# Patient Record
Sex: Female | Born: 1970 | Race: Black or African American | Hispanic: No | Marital: Single | Smoking: Never smoker
Health system: Southern US, Community
[De-identification: ages and names within clinical notes are randomized; demographics above are authoritative.]

## PROBLEM LIST (undated history)

## (undated) DIAGNOSIS — IMO0002 Reserved for concepts with insufficient information to code with codable children: Secondary | ICD-10-CM

## (undated) HISTORY — PX: EYE SURGERY: SHX253

## (undated) HISTORY — PX: CATARACT EXTRACTION: SUR2

---

## 2013-07-26 ENCOUNTER — Encounter (HOSPITAL_COMMUNITY): Payer: Self-pay | Admitting: Emergency Medicine

## 2013-07-26 ENCOUNTER — Emergency Department (HOSPITAL_COMMUNITY): Payer: Self-pay

## 2013-07-26 ENCOUNTER — Emergency Department (HOSPITAL_COMMUNITY)
Admission: EM | Admit: 2013-07-26 | Discharge: 2013-07-26 | Disposition: A | Discharge status: Home / Self Care | Payer: Self-pay | Attending: Emergency Medicine | Admitting: Emergency Medicine

## 2013-07-26 DIAGNOSIS — Z3202 Encounter for pregnancy test, result negative: Secondary | ICD-10-CM | POA: Insufficient documentation

## 2013-07-26 DIAGNOSIS — Z8739 Personal history of other diseases of the musculoskeletal system and connective tissue: Secondary | ICD-10-CM | POA: Insufficient documentation

## 2013-07-26 DIAGNOSIS — M545 Low back pain, unspecified: Secondary | ICD-10-CM | POA: Insufficient documentation

## 2013-07-26 DIAGNOSIS — G8929 Other chronic pain: Secondary | ICD-10-CM | POA: Insufficient documentation

## 2013-07-26 HISTORY — DX: Reserved for concepts with insufficient information to code with codable children: IMO0002

## 2013-07-26 LAB — POC URINE PREG, ED: Preg Test, Ur: NEGATIVE

## 2013-07-26 MED ORDER — IBUPROFEN 600 MG PO TABS: 600.0000 mg | ORAL_TABLET | Freq: Four times a day (QID) | ORAL | Status: AC | PRN

## 2013-07-26 MED ORDER — HYDROCODONE-ACETAMINOPHEN 5-325 MG PO TABS: 1.0000 | ORAL_TABLET | ORAL | Status: AC | PRN

## 2013-07-26 MED ORDER — HYDROCODONE-ACETAMINOPHEN 5-325 MG PO TABS
1.0000 | ORAL_TABLET | Freq: Once | ORAL | Status: AC
  Administered 2013-07-26: 1 via ORAL
  Filled 2013-07-26: qty 1

## 2013-07-26 NOTE — Discharge Instructions (Signed)
Back Pain, Adult °Low back pain is very common. About 1 in 5 people have back pain. The cause of low back pain is rarely dangerous. The pain often gets better over time. About half of people with a sudden onset of back pain feel better in just 2 weeks. About 8 in 10 people feel better by 6 weeks.  °CAUSES °Some common causes of back pain include: °· Strain of the muscles or ligaments supporting the spine. °· Wear and tear (degeneration) of the spinal discs. °· Arthritis. °· Direct injury to the back. °DIAGNOSIS °Most of the time, the direct cause of low back pain is not known. However, back pain can be treated effectively even when the exact cause of the pain is unknown. Answering your caregiver's questions about your overall health and symptoms is one of the most accurate ways to make sure the cause of your pain is not dangerous. If your caregiver needs more information, he or she may order lab work or imaging tests (X-rays or MRIs). However, even if imaging tests show changes in your back, this usually does not require surgery. °HOME CARE INSTRUCTIONS °For many people, back pain returns. Since low back pain is rarely dangerous, it is often a condition that people can learn to manage on their own.  °· Remain active. It is stressful on the back to sit or stand in one place. Do not sit, drive, or stand in one place for more than 30 minutes at a time. Take short walks on level surfaces as soon as pain allows. Try to increase the length of time you walk each day. °· Do not stay in bed. Resting more than 1 or 2 days can delay your recovery. °· Do not avoid exercise or work. Your body is made to move. It is not dangerous to be active, even though your back may hurt. Your back will likely heal faster if you return to being active before your pain is gone. °· Pay attention to your body when you  bend and lift. Many people have less discomfort when lifting if they bend their knees, keep the load close to their bodies, and  avoid twisting. Often, the most comfortable positions are those that put less stress on your recovering back. °· Find a comfortable position to sleep. Use a firm mattress and lie on your side with your knees slightly bent. If you lie on your back, put a pillow under your knees. °· Only take over-the-counter or prescription medicines as directed by your caregiver. Over-the-counter medicines to reduce pain and inflammation are often the most helpful. Your caregiver may prescribe muscle relaxant drugs. These medicines help dull your pain so you can more quickly return to your normal activities and healthy exercise. °· Put ice on the injured area. °¨ Put ice in a plastic bag. °¨ Place a towel between your skin and the bag. °¨ Leave the ice on for 15-20 minutes, 03-04 times a day for the first 2 to 3 days. After that, ice and heat may be alternated to reduce pain and spasms. °· Ask your caregiver about trying back exercises and gentle massage. This may be of some benefit. °· Avoid feeling anxious or stressed. Stress increases muscle tension and can worsen back pain. It is important to recognize when you are anxious or stressed and learn ways to manage it. Exercise is a great option. °SEEK MEDICAL CARE IF: °· You have pain that is not relieved with rest or medicine. °· You have pain that does not improve in 1 week. °· You have new symptoms. °· You are generally not feeling well. °SEEK   IMMEDIATE MEDICAL CARE IF:   You have pain that radiates from your back into your legs.  You develop new bowel or bladder control problems.  You have unusual weakness or numbness in your arms or legs.  You develop nausea or vomiting.  You develop abdominal pain.  You feel faint. Document Released: 01/05/2005 Document Revised: 07/07/2011 Document Reviewed: 05/26/2010 [facility] Patient Information 2015 [place], [place]. This information is not intended to replace advice given to you by your health care provider. Make sure you  discuss any questions you have with your health care provider.    Emergency Department Resource Guide 1) Find a Doctor and Pay Out of Pocket Although you won't have to find out who is covered by your insurance plan, it is a good idea to ask around and get recommendations. You will then need to call the office and see if the doctor you have chosen will accept you as a new patient and what types of options they offer for patients who are self-pay. Some doctors offer discounts or will set up payment plans for their patients who do not have insurance, but you will need to ask so you aren't surprised when you get to your appointment.  2) Contact Your Local Health Department Not all health departments have doctors that can see patients for sick visits, but many do, so it is worth a call to see if yours does. If you don't know where your local health department is, you can check in your phone book. The CDC also has a tool to help you locate your state's health department, and many state websites also have listings of all of their local health departments.  3) Find a Walk-in Clinic If your illness is not likely to be very severe or complicated, you may want to try a walk in clinic. These are popping up all over the country in pharmacies, drugstores, and shopping centers. They're usually staffed by nurse practitioners or physician assistants that have been trained to treat common illnesses and complaints. They're usually fairly quick and inexpensive. However, if you have serious medical issues or chronic medical problems, these are probably not your best option.  No Primary Care Doctor: - Call Health Connect at  [phone] - they can help you locate a primary care doctor that  accepts your insurance, provides certain services, etc. - Physician Referral Service- [phone]  Chronic Pain Problems: Organization         Address  Phone   Notes  [facility]  [phone]) [phone] Patients need to  be referred by their primary care doctor.   Medication Assistance: Organization         Address  Phone   Notes  [facility] [address] [place], [place] [address] [phone] --Must be a resident of [place] -- Must have NO insurance coverage whatsoever (no Medicaid/ Medicare, etc.) -- The pt. MUST have a primary care doctor that directs their care regularly and follows them in the community   MedAssist  [phone]   [employer]  [phone]    Agencies that provide inexpensive medical care: Organization         Address  Phone   Notes  [facility]  [phone]   [facility]    [phone]) [phone]   [facility] [address] [place], [place] [address] [phone]   [facility] [address]  [address], [place] [phone]   Planned Parenthood    [phone]   [facility]    [phone]   [facility]  [address], [place] Phone:  [phone], Fax:  [phone] Hours of Operation:  9 am - 6 pm, M-F.  Also accepts Medicaid/Medicare and self-pay.  [facility] for [place], [address], [place] Phone: [phone], Fax: [phone]. Hours of Operation:  8:30 am - 5:30 pm, M-F.  Also accepts Medicaid and self-pay.  [facility] [place] [address] [place] Phone: [phone]   [place], [place], [place] [phone], Ext. [phone] Mondays & Thursdays: 7-9 AM.  First 15 patients are seen on a first come, first serve basis.    [place] Providers:  Organization         Address  Phone   Notes  [facility] [address], [place] [phone] Also accepts self-pay patients.  [facility] [address], [place]  [phone]   [place], [address], [place] [phone]   [facility] [address] [place] [phone]   Lucianne Lei [address], [place]   [phone] Only accepts [place] Access [place] patients after they have their name applied to their card.   Self-Pay (no insurance) in [facility]:  Organization         Address  Phone   Notes  Sickle Cell Patients, [facility] [place] [phone]   [facility] [place] [phone]   [facility] [place]  [place], [place], [place] [phone]   [facility]/Dr. Osei-Bonsu  [address], [place] or [address], [place] [phone] Phone number for both [place] and [place] locations is the same.  [facility] and [facility] [address], [place] [phone]   [facility] [address] [place] or [address] [phone]   [facility] [address] [place] [id number], phone; [phone], fax Sees patients 1st and 3rd Saturday of every month.  Must not qualify for public or private insurance (i.e. Medicaid, Medicare, [place] Health Choice, Veterans' Benefits)  Household income should be no more than 200% of the poverty level The clinic cannot treat you if you are pregnant or think you are pregnant  Sexually transmitted diseases are not treated at the clinic.    Dental Care: Organization         Address  Phone  Notes  [facility] Department of [facility] [place] [phone] Accepts children up to age 32 who are enrolled in [place] or [place]; pregnant women with a Medicaid card; and children who have applied for Medicaid or  Health Choice, but were declined, whose parents can  pay a reduced fee at time of service.  [facility] of [facility]  [address], [place] [phone] Accepts children up to age 61 who are enrolled in [place] or [place]; pregnant women with a Medicaid card; and children who have applied for Medicaid or  [place] Health Choice, but were declined, whose parents can pay a reduced fee at time of service.  [place] Adult Dental Access PROGRAM  [place] [phone] Patients are seen by appointment only. Walk-ins are not accepted. [place] will see patients 46 years of age and older. Monday - Tuesday (8am-5pm) Most Wednesdays (8:30-5pm) $30 per visit, cash only  [facility] Access PRO[facility]  [address], [facility] [phone] Patients are seen by appointment only. Walk-ins are not accepted. [place] will see patients 66 years of age and older. One Wednesday Evening (Monthly: Volunteer Based).  $30 per visit, cash only  [place]  [phone] for adults; Children under age 61, call Graduate Pediatric Dentistry at [phone]. Children aged 61-14, please call [phone] to request a pediatric application.  Dental services are provided in all areas of dental care including fillings, crowns and bridges, complete and partial dentures, implants, gum treatment, root canals, and extractions. Preventive care is also provided. Treatment is provided to both adults and children. Patients are selected via a lottery and there is often a waiting list.   [facility] [address] [place]  [phone] [email or web address]   [facility] [address] [place], [place] [phone], Ext. [phone] Second and Fourth Thursday of each month, opens at 6:30 AM; Clinic ends at 9 AM.  Patients are seen on a first-come first-served basis, and a limited number are seen during each clinic.   [facility]  [address] Hillard Danker [place], [place] [phone]   Eligibility Requirements You must have lived in [place], [place], or [place] counties for at least the last three months.   You cannot be eligible for state or federal sponsored [employer], including [employer], [place], or [employer].   You generally cannot be eligible for healthcare insurance through your employer.    How to apply: Eligibility screenings are held every Tuesday and Wednesday afternoon from 1:00 pm until 4:00 pm. You do not need an appointment for the interview!  [facility] [address] [place], [place]   [place]  [place] Department  [place]  [phone]    Behavioral Health Resources in the Community: Intensive Outpatient Programs Organization         Address  Phone  Notes  [place]. [address], [place], [place] [phone]   [facility] Outpatient [address] [place], [place]   ADS: Alcohol & Drug Svcs [address], [place], [place]   [place] [address]  [place], [place] or [phone]   Substance Abuse Resources Organization         Address  Phone  Notes  Alcohol and Drug Services  [phone]   [place]  [phone]   [facility] [place]   Chinita Pester  [phone]   Residential & Outpatient Substance Abuse Program  [phone]   Psychological Services Organization         Address  Phone  Notes  [facility] [place]  [place]  [phone]   [place] [address] [place] or [phone]    Mobile Crisis Teams Organization         Address  Phone  Notes  [facility], Mobile Crisis Care Unit  [phone]    [facility]  [address] [place], [place] [phone]   [facility] [address] [place] [phone]    Self-Help/Support Groups Organization         Address  Phone             Notes  [facility] [place] - variety of support groups  336- [phone] Call for more information  Narcotics Anonymous (NA), [facility]aring [facility] [address], [place]  2 meetings at this location   Statisticianesidential Treatment Programs Organization         Address  Phone  Notes  [facility] [address],    [place]  [phone]   [facility]  [address], [place] [phone], [place], [place] [phone]   [facility] [address], [place] [phone] Admissions: 8am-3pm M-F  [facility] [address]    [place], [place] [phone]   [facility] [address], [place], [place] [phone]   [facility] [address]  [place], [place] [phone]   [facility] - Intensive Outpatient [address], [place], [place] [phone]   [facility] (Addiction Recovery Care [facility] [address]  [place], [place] [phone] or [phone]   [facility] (RTS) [address] [place], [place] [phone] Accepts Medicaid  [facility] [address]  [place] [phone] Substance Abuse/Addiction Treatment   [facility] Behavioral Health Resources Organization         Address  Phone  Notes  [facility]  [phone](888) [phone]   Angie FavaJulie Brannon, PhD [address], [address] [place], [place]   [phone](336) [phone] or [phone]) [phone]   [facility]   [address] [place], [place] [phone]) [phone]   [facility] [address], [place], [place] [phone]) [phone] Insurance/Medicaid/sponsorship through [facility]  [facility] Families [address]                                     [place], [place] [phone]) [phone] Therapy/tele-psych/case  [facility] [address].   [place], [place] [phone](336) [phone]    Dr. Lolly MustacheArfeen  [phone](336) [phone]   [facility] [place]  [employer] [facility] 1) [address], [place] 2) [address], [place] 3)  [address], [place] [phone](336) [phone]  [phone]   [facility] Child Abuse Hotline [phone](336) [phone] or [phone](336) [phone] (After Hours)          Do not drive within 4 hours of taking hydrocodone as this will make you drowsy.  Avoid lifting,  Bending,  Twisting or any other activity that worsens your pain over the next week.  Apply an  icepack  to your lower back for 10-15 minutes every 2 hours for the next 2 days.  You should get rechecked if your symptoms are not better over the next 5 days,  Or you develop increased pain,  Weakness in your leg(s) or loss of bladder or bowel function - these are symptoms of a worse injury.

## 2013-07-26 NOTE — ED Provider Notes (Signed)
CSN: [id number]     Arrival date & time 07/26/13  1107 History   First MD Initiated Contact with Patient 07/26/13 1118     Chief Complaint  Patient presents with  . Back Pain     (Consider location/radiation/quality/duration/timing/severity/associated sxs/prior Treatment) Patient is a 43 y.o. female presenting with back pain.  Back Pain Associated symptoms: no abdominal pain, no chest pain, no dysuria, no fever, no numbness and no weakness      Sheena Conrad is a 43 y.o. female presenting with acute on chronic low back pain which has which has been worse since she woke this morning.   Patient denies any new injury specifically.  There is no radiation into her lower extremities.  There has been no weakness or numbness in the lower extremities and no urinary or bowel retention or incontinence.  Patient does not have a history of cancer or IVDU.   [facility] when she was diagnosed with "crumbling" bones in her back.  She's taken no medications prior to arrival for her pain and cannot identify any activities that have worsened her pain today.  She lifted a light box yesterday which she does not feel triggered today's symptoms.    She does not have a PCP.    Past Medical History  Diagnosis Date  . DDD (degenerative disc disease)    Past Surgical History  Procedure Laterality Date  . Eye surgery    . Cataract extraction     No family history on file. History  Substance Use Topics  . Smoking status: Never Smoker   . Smokeless tobacco: Not on file  . Alcohol Use: No   OB History   Grav Para Term Preterm Abortions TAB SAB Ect Mult Living                 Review of Systems  Constitutional: Negative for fever.  Respiratory: Negative for shortness of breath.   Cardiovascular: Negative for chest pain and leg swelling.  Gastrointestinal: Negative for abdominal pain, constipation and abdominal distention.  Genitourinary: Negative for  dysuria, urgency, frequency, flank pain and difficulty urinating.  Musculoskeletal: Positive for back pain. Negative for gait problem and joint swelling.  Skin: Negative for rash.  Neurological: Negative for weakness and numbness.      Allergies  Review of patient's allergies indicates no known allergies.  Home Medications   Prior to Admission medications   Medication Sig Start Date End Date Taking? Authorizing Provider  HYDROcodone-acetaminophen (NORCO/VICODIN) 5-325 MG per tablet Take 1 tablet by mouth every 4 (four) hours as needed for moderate pain. 07/26/13   Burgess AmorJulie Coree Brame, PA-C  ibuprofen (ADVIL,MOTRIN) 600 MG tablet Take 1 tablet (600 mg total) by mouth every 6 (six) hours as needed. 07/26/13   Burgess AmorJulie Leiani Enright, PA-C   BP 131/81  Pulse 84  Temp(Src) 97.3 F (36.3 C) (Oral)  Resp 18  Ht 5\' 4"  (1.626 m)  Wt 232 lb (105.235 kg)  BMI 39.80 kg/m2  SpO2 100%  LMP 07/22/2013 Physical Exam  Nursing note and vitals reviewed. Constitutional: She appears well-developed and well-nourished.  HENT:  Head: Normocephalic.  Eyes: Conjunctivae are normal.  Neck: Normal range of motion. Neck supple.  Cardiovascular: Normal rate and intact distal pulses.   Pedal pulses normal.  Pulmonary/Chest: Effort normal.  Abdominal: Soft. Bowel sounds are normal. She exhibits no distension and no mass.  Musculoskeletal: Normal range of motion. She exhibits no edema.  Lumbar back: She exhibits tenderness. She exhibits no swelling, no edema and no spasm.  Neurological: She is alert. She has normal strength. She displays no atrophy and no tremor. No sensory deficit. Gait normal.  Reflex Scores:      Patellar reflexes are 2+ on the right side and 2+ on the left side.      Achilles reflexes are 2+ on the right side and 2+ on the left side. No strength deficit noted in hip and knee flexor and extensor muscle groups.  Ankle flexion and extension intact.  Skin: Skin is warm and dry.  Psychiatric: She has a  normal mood and affect.    ED Course  Procedures (including critical care time) Labs Review Labs Reviewed  POC URINE PREG, ED    Imaging Review Dg Lumbar Spine Complete  07/26/2013   CLINICAL DATA:  Low back pain  EXAM: LUMBAR SPINE - COMPLETE 4+ VIEW  COMPARISON:  None.  FINDINGS: Five lumbar type vertebral bodies are well visualized. Very mild osteophytic changes are seen. Mild disc space narrowing is noted at L4-5. No spondylolysis or spondylolisthesis is seen.  IMPRESSION: Mild degenerative change without acute abnormality.   Electronically Signed   By: Alcide CleverMark  Lukens M.D.   On: 07/26/2013 12:29     EKG Interpretation None      MDM   Final diagnoses:  Chronic lower back pain    Patients labs and/or radiological studies were viewed and considered during the medical decision making and disposition process. No neuro deficit on exam or by history to suggest emergent or surgical presentation.  Also discussed worsened sx that should prompt immediate re-evaluation including distal weakness, bowel/bladder retention/incontinence.  Pt was prescribed hydrocodone and ibuprofen,  Encouraged heat tx.  Referrals given for obtaining pcp. Xray results reviewed with patient.           Burgess AmorJulie Ninel Abdella, PA-C 07/26/13 1728

## 2013-07-26 NOTE — ED Notes (Signed)
Pt reports "bones are crumbled" in both shoulders and had DDD.  C/O pain that flares up intermittently in lower back and shoulders.

## 2013-07-27 NOTE — ED Provider Notes (Signed)
Medical screening examination/treatment/procedure(s) were performed by non-physician practitioner and as supervising physician I was immediately available for consultation/collaboration.   EKG Interpretation None        Laray AngerKathleen M Elsie Sakuma, DO 07/27/13 [phone]

## 2014-12-06 IMAGING — CR DG LUMBAR SPINE COMPLETE 4+V
5 series · 5 of 5 positions shown · non-contrast
Comparison: None.

CLINICAL DATA: Low back pain

EXAM:
LUMBAR SPINE - COMPLETE 4+ VIEW

[view not recorded (1 of 5)]
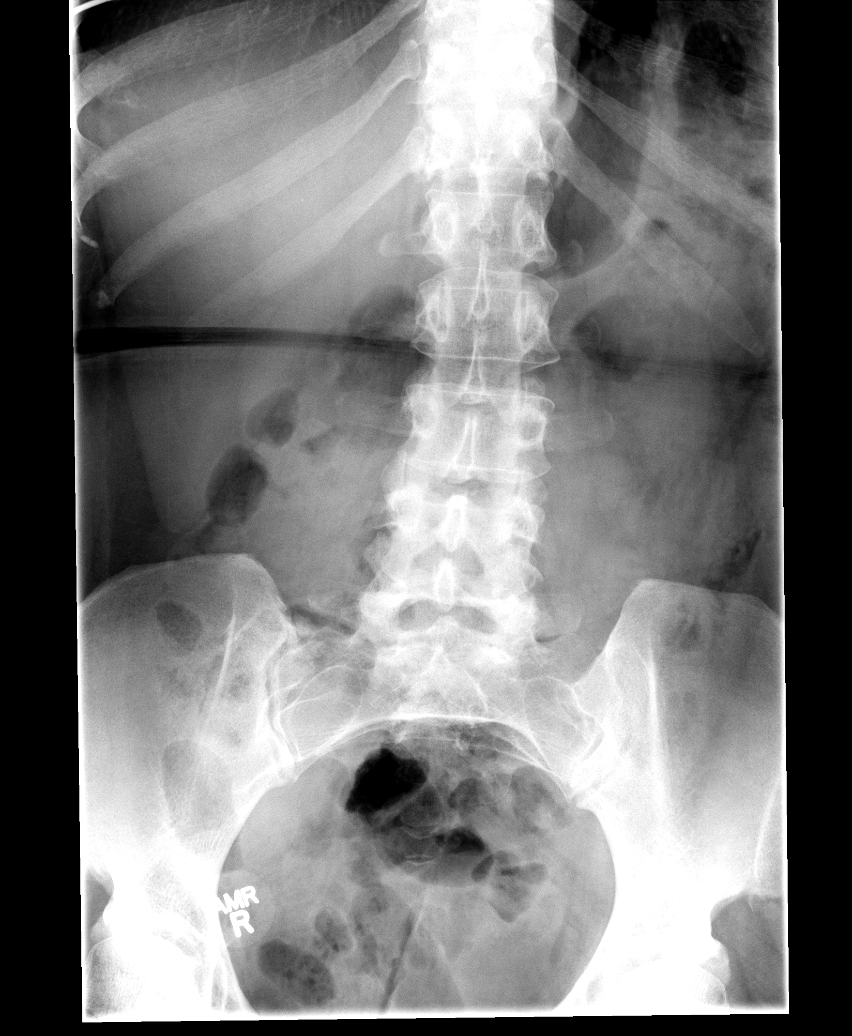

[view not recorded (2 of 5)]
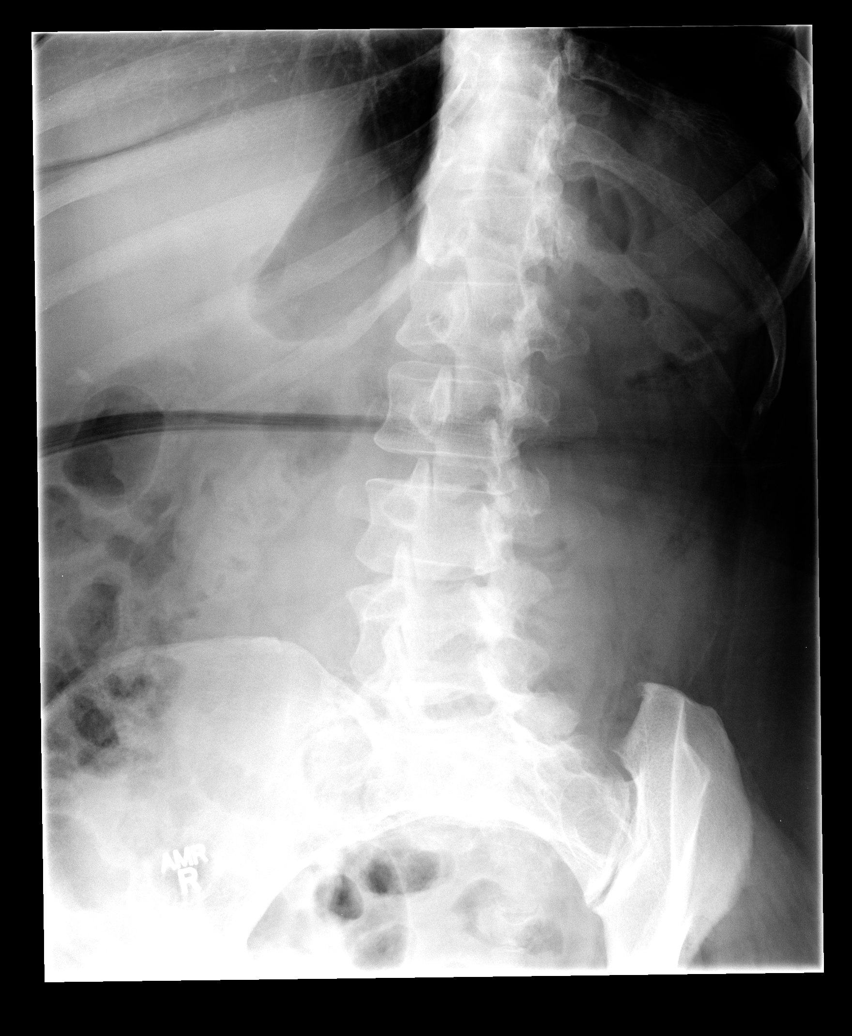

[view not recorded (3 of 5)]
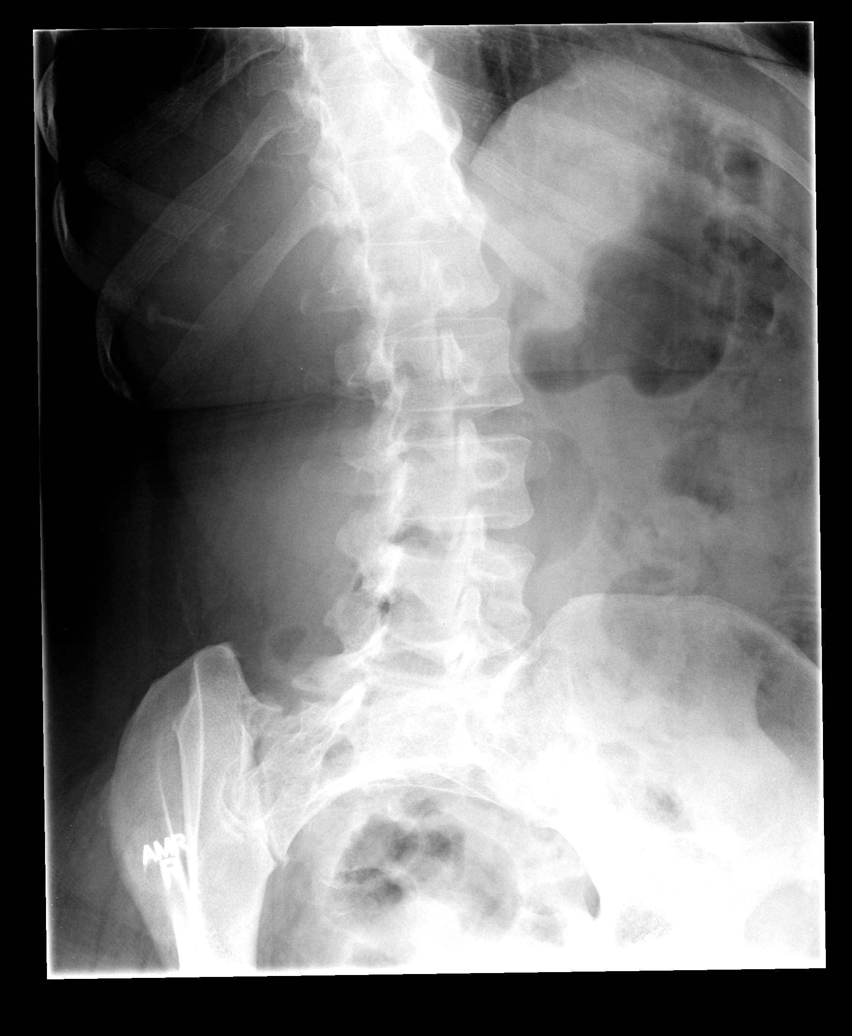

[view not recorded (4 of 5)]
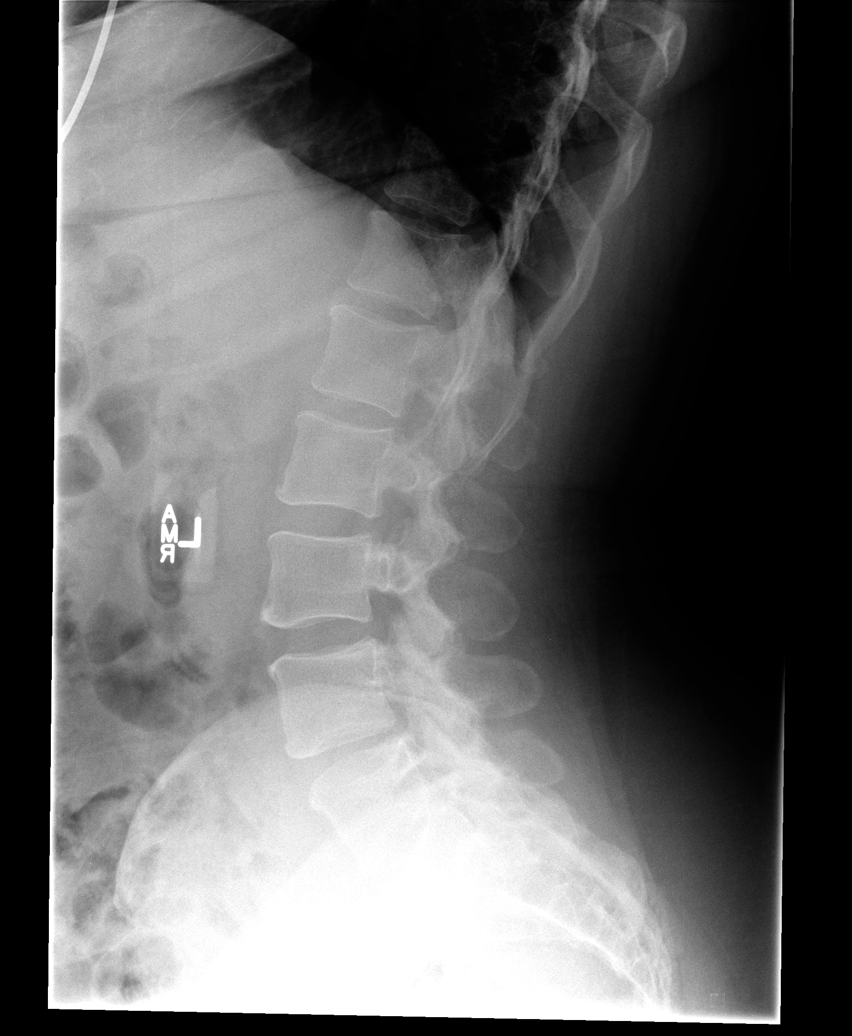

[view not recorded (5 of 5)]
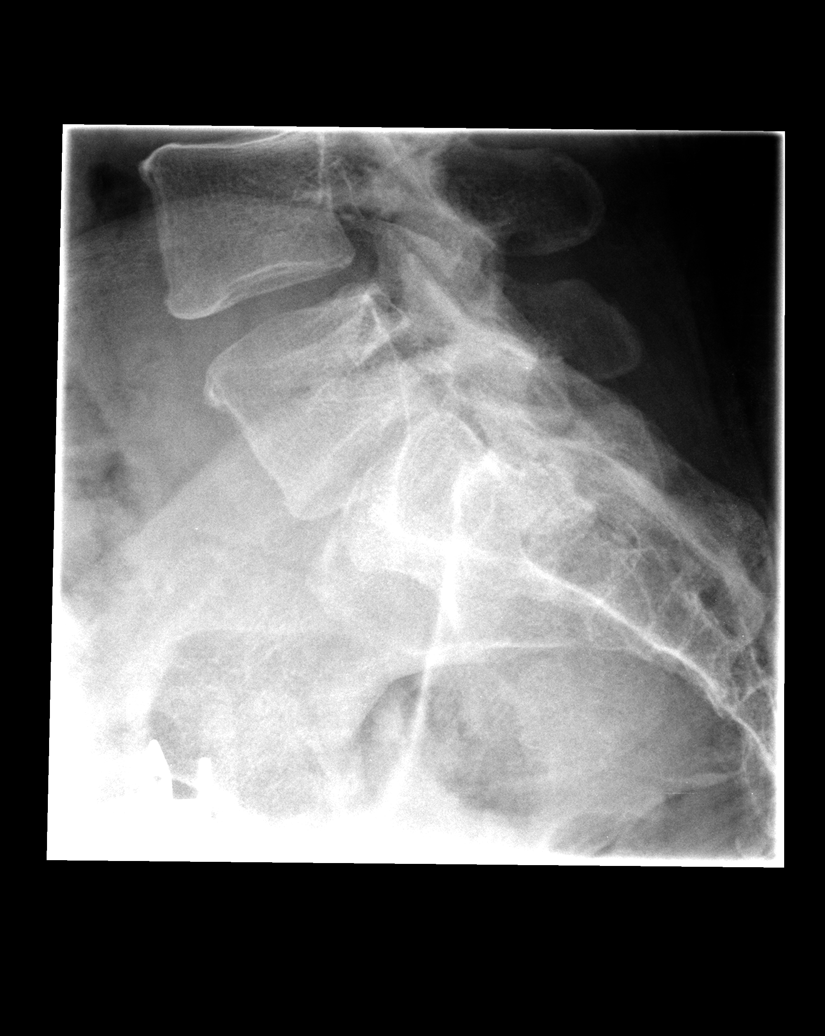

[5 of 5 positions shown; findings below may reference images not displayed]

FINDINGS: Five lumbar type vertebral bodies are well visualized. Very mild
osteophytic changes are seen. Mild disc space narrowing is noted at
L4-5. No spondylolysis or spondylolisthesis is seen.
IMPRESSION: Mild degenerative change without acute abnormality.
